# Patient Record
Sex: Male | Born: 1980 | Marital: Single | Smoking: Current every day smoker
Health system: Southern US, Community
[De-identification: ages and names within clinical notes are randomized; demographics above are authoritative.]

## PROBLEM LIST (undated history)

## (undated) DIAGNOSIS — I998 Other disorder of circulatory system: Secondary | ICD-10-CM

---

## 2008-01-20 ENCOUNTER — Ambulatory Visit (HOSPITAL_BASED_OUTPATIENT_CLINIC_OR_DEPARTMENT_OTHER): Admission: RE | Admit: 2008-01-20 | Discharge: 2008-01-20 | Payer: Self-pay | Admitting: Physician Assistant

## 2008-01-29 ENCOUNTER — Ambulatory Visit: Payer: Self-pay | Admitting: Internal Medicine

## 2010-11-16 ENCOUNTER — Encounter: Payer: Self-pay | Admitting: Family Medicine

## 2011-03-13 NOTE — Procedures (Signed)
NAME:  Jeremiah Mathis, Jeremiah Mathis NO.:  [id number]   MEDICAL RECORD NO.:  [id number]          PATIENT TYPE:  OUT   LOCATION:  SLEEP CENTER                 FACILITY:  [facility]   PHYSICIAN:  Clinton D. Maple Hudson, MD, FCCP, FACPDATE OF BIRTH:  Mar 14, 1979   DATE OF STUDY:                            NOCTURNAL POLYSOMNOGRAM   REFERRING PHYSICIAN:   REFERRING PHYSICIAN:  Lindaann Pascal, M.D.   INDICATION FOR STUDY:  Hypersomnia with sleep apnea.   EPWORTH SLEEPINESS SCORE:  19/24.   BMI:  20.8.   WEIGHT:  162 pounds.   HEIGHT:  74 inches.   NECK:  15.5 inches.   HOME MEDICATION:  Charted and reviewed.   SLEEP ARCHITECTURE:  Total sleep 383 minutes with sleep efficiency  96.1%.  Stage 1 was 3%, stage 2, 55%, stage 3, 23%.  REM 18.5% of total  sleep time.  Sleep latency 6 minutes.  REM latency 99 minutes.  Awake  after sleep onset 9.5 minutes, arousal index 6.3.  No bedtime medication  was taken.   RESPIRATORY DATA:  Apnea-hypopnea index (AHI) 3.8 per hour which is  normal.  A total of 24 events were counted of which 20 were obstructive  sleep apneas, 1 central apnea, 1 mixed apnea, and 2 hypopneas.  Most  events were counted while sleeping off of his back.  There were  insufficient events to permit CPAP titration with protocol on this study  night.   OXYGEN DATA:  Moderate snoring with oxygen desaturation to a nadir of  90%.  Mean oxygen saturation through the study was 95.7% on room air.   CARDIAC DATA:  Normal sinus rhythm with PACs.   IMPRESSION:  1. Occasional respiratory events, within normal limits, AHI 3.8 per      hour (normal range 0-5 per hour).  Events were more common while      sleeping off flat of back.  Moderate snoring with oxygen      desaturation to a nadir of 90%.  2. Scores in this range are not usually addressed with CPAP.  If      daytime somnolence is a significant concern, consider having      patient return for a multiple sleep latency test which  is      specifically for assessment of excessive daytime sleepiness.      Clinton D. Maple Hudson, MD, [facility], FACP  Diplomate, Biomedical engineer of Sleep Medicine  Electronically Signed     CDY/MEDQ  D:  01/28/2008 14:55:41  T:  01/28/2008 15:46:43  Job:  [id number]   cc:   Lindaann Pascal, M.D.

## 2017-11-26 DIAGNOSIS — G4719 Other hypersomnia: Secondary | ICD-10-CM | POA: Insufficient documentation

## 2018-01-25 DIAGNOSIS — E039 Hypothyroidism, unspecified: Secondary | ICD-10-CM | POA: Insufficient documentation

## 2018-01-25 DIAGNOSIS — E782 Mixed hyperlipidemia: Secondary | ICD-10-CM | POA: Insufficient documentation

## 2018-11-17 ENCOUNTER — Emergency Department (HOSPITAL_BASED_OUTPATIENT_CLINIC_OR_DEPARTMENT_OTHER): Payer: Self-pay

## 2018-11-17 ENCOUNTER — Encounter (HOSPITAL_BASED_OUTPATIENT_CLINIC_OR_DEPARTMENT_OTHER): Payer: Self-pay | Admitting: *Deleted

## 2018-11-17 ENCOUNTER — Emergency Department (HOSPITAL_BASED_OUTPATIENT_CLINIC_OR_DEPARTMENT_OTHER)
Admission: EM | Admit: 2018-11-17 | Discharge: 2018-11-17 | Disposition: A | Discharge status: Home / Self Care | Payer: Self-pay | Attending: Emergency Medicine | Admitting: Emergency Medicine

## 2018-11-17 ENCOUNTER — Other Ambulatory Visit: Payer: Self-pay

## 2018-11-17 DIAGNOSIS — R0781 Pleurodynia: Secondary | ICD-10-CM

## 2018-11-17 DIAGNOSIS — M7989 Other specified soft tissue disorders: Secondary | ICD-10-CM | POA: Insufficient documentation

## 2018-11-17 DIAGNOSIS — F17210 Nicotine dependence, cigarettes, uncomplicated: Secondary | ICD-10-CM | POA: Insufficient documentation

## 2018-11-17 LAB — CBC WITH DIFFERENTIAL/PLATELET
Abs Immature Granulocytes: 0.05 10*3/uL (ref 0.00–0.07)
Basophils Absolute: 0.1 10*3/uL (ref 0.0–0.1)
Basophils Relative: 1 %
EOS PCT: 6 %
Eosinophils Absolute: 0.7 10*3/uL — ABNORMAL HIGH (ref 0.0–0.5)
HEMATOCRIT: 47.5 % (ref 39.0–52.0)
Hemoglobin: 15.3 g/dL (ref 13.0–17.0)
Immature Granulocytes: 0 %
LYMPHS ABS: 2.8 10*3/uL (ref 0.7–4.0)
Lymphocytes Relative: 25 %
MCH: 30.9 pg (ref 26.0–34.0)
MCHC: 32.2 g/dL (ref 30.0–36.0)
MCV: 96 fL (ref 80.0–100.0)
MONO ABS: 0.7 10*3/uL (ref 0.1–1.0)
MONOS PCT: 6 %
Neutro Abs: 6.9 10*3/uL (ref 1.7–7.7)
Neutrophils Relative %: 62 %
Platelets: 347 10*3/uL (ref 150–400)
RBC: 4.95 MIL/uL (ref 4.22–5.81)
RDW: 13.3 % (ref 11.5–15.5)
WBC: 11.3 10*3/uL — ABNORMAL HIGH (ref 4.0–10.5)
nRBC: 0 % (ref 0.0–0.2)

## 2018-11-17 LAB — COMPREHENSIVE METABOLIC PANEL
ALK PHOS: 68 U/L (ref 38–126)
ALT: 32 U/L (ref 0–44)
ANION GAP: 7 (ref 5–15)
AST: 49 U/L — ABNORMAL HIGH (ref 15–41)
Albumin: 4.1 g/dL (ref 3.5–5.0)
BILIRUBIN TOTAL: 0.3 mg/dL (ref 0.3–1.2)
BUN: 11 mg/dL (ref 6–20)
CALCIUM: 9.1 mg/dL (ref 8.9–10.3)
CO2: 27 mmol/L (ref 22–32)
Chloride: 103 mmol/L (ref 98–111)
Creatinine, Ser: 1.14 mg/dL (ref 0.61–1.24)
GLUCOSE: 78 mg/dL (ref 70–99)
Potassium: 3.9 mmol/L (ref 3.5–5.1)
Sodium: 137 mmol/L (ref 135–145)
TOTAL PROTEIN: 6.7 g/dL (ref 6.5–8.1)

## 2018-11-17 LAB — PROTIME-INR
INR: 0.9
Prothrombin Time: 12 seconds (ref 11.4–15.2)

## 2018-11-17 LAB — URINALYSIS, ROUTINE W REFLEX MICROSCOPIC
Bilirubin Urine: NEGATIVE
Glucose, UA: NEGATIVE mg/dL
Hgb urine dipstick: NEGATIVE
Ketones, ur: NEGATIVE mg/dL
Leukocytes, UA: NEGATIVE
Nitrite: NEGATIVE
PH: 7 (ref 5.0–8.0)
Protein, ur: NEGATIVE mg/dL
Specific Gravity, Urine: 1.01 (ref 1.005–1.030)

## 2018-11-17 LAB — LIPASE, BLOOD: Lipase: 53 U/L — ABNORMAL HIGH (ref 11–51)

## 2018-11-17 MED ORDER — SODIUM CHLORIDE 0.9 % IV SOLN
Freq: Once | INTRAVENOUS | Status: AC
  Administered 2018-11-17: 18:00:00 via INTRAVENOUS

## 2018-11-17 MED ORDER — IOPAMIDOL (ISOVUE-370) INJECTION 76%
100.0000 mL | Freq: Once | INTRAVENOUS | Status: AC | PRN
  Administered 2018-11-17: 100 mL via INTRAVENOUS

## 2018-11-17 NOTE — ED Notes (Signed)
Pt on monitor 

## 2018-11-17 NOTE — ED Triage Notes (Signed)
Pt c/o chest pain med sternal x 2 weeks

## 2018-11-17 NOTE — ED Provider Notes (Signed)
[facility] EMERGENCY DEPARTMENT Provider Note   CSN: [id number] Arrival date & time: 11/17/18  1631     History   Chief Complaint Chief Complaint  Patient presents with  . Chest Pain    HPI Jeremiah Mathis is a 38 y.o. male.  HPI Patient reports that he gets intermittent chest pains.  They are sharp in nature.  They occur in different parts of his chest.  He reports he does feel short of breath with that.  Patient reports that he does have a history of swelling in the left leg and superficial clot.  He used to drive truck over the road.  He reports he had stopped doing that but he still has a problem with swelling in that leg.  He reports his mother had DVTs and he is worried about that.  He reports that he has not had fever or cough.  He reports sometimes also if he has been sitting upright position he has numbness in both of his legs.  This resolves if he gets up and moves.  He denies he has pain in his back.  He denies difficulty urinating pain burning or incontinence.  No vomiting or diarrhea.  Patient reports he did see a doctor at [facility] several years ago, at that time the told him he had a superficial clot in the leg but did not need any treatment.  He reports that due to working a lot and then loss of insurance he has not seen a physician in the meantime.  Reports he has been pretty worried about the possibility of getting a blood clot to the lungs. History reviewed. No pertinent past medical history.  There are no active problems to display for this patient.   History reviewed. No pertinent surgical history.      Home Medications    Prior to Admission medications   Not on File    Family History No family history on file.  Social History Social History   Tobacco Use  . Smoking status: Current Every Day Smoker    Packs/day: 1.00    Types: Cigarettes  . Smokeless tobacco: Never Used  Substance Use Topics  . Alcohol use: Not Currently  . Drug use:  Not Currently     Allergies   Patient has no known allergies.   Review of Systems Review of Systems 10 Systems reviewed and are negative for acute change except as noted in the HPI.   Physical Exam Updated Vital Signs BP 101/72   Pulse 77   Temp 98.3 F (36.8 C) (Oral)   Resp 15   Ht 6\' 1"  (1.854 m)   Wt 127 kg   SpO2 100%   BMI 36.94 kg/m   Physical Exam Constitutional:      Appearance: Normal appearance. He is well-developed.  HENT:     Head: Normocephalic and atraumatic.  Eyes:     Extraocular Movements: Extraocular movements intact.  Neck:     Musculoskeletal: Neck supple.  Cardiovascular:     Rate and Rhythm: Normal rate and regular rhythm.     Heart sounds: Normal heart sounds.  Pulmonary:     Effort: Pulmonary effort is normal.     Breath sounds: Normal breath sounds.  Chest:     Chest wall: No tenderness.  Abdominal:     General: Bowel sounds are normal. There is no distension.     Palpations: Abdomen is soft.     Tenderness: There is no abdominal tenderness.  Musculoskeletal: Normal range of motion.     Comments: Patient has edema around the ankle of the left lower extremity.  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  The calf is nontender.  Appears slightly more swollen than the right.  Feet are warm and dry.  Dorsalis pedis pulses symmetric.  Skin:    General: Skin is warm and dry.  Neurological:     General: No focal deficit present.     Mental Status: He is alert and oriented to person, place, and time.     GCS: GCS eye subscore is 4. GCS verbal subscore is 5. GCS motor subscore is 6.     Sensory: No sensory deficit.     Motor: No weakness.     Coordination: Coordination normal.  Psychiatric:        Mood and Affect: Mood normal.      ED Treatments / Results  Labs (all labs ordered are listed, but only abnormal results are displayed) Labs Reviewed  COMPREHENSIVE METABOLIC PANEL - Abnormal; Notable for the following components:      Result Value    AST 49 (*)    All other components within normal limits  LIPASE, BLOOD - Abnormal; Notable for the following components:   Lipase 53 (*)    All other components within normal limits  CBC WITH DIFFERENTIAL/PLATELET - Abnormal; Notable for the following components:   WBC 11.3 (*)    Eosinophils Absolute 0.7 (*)    All other components within normal limits  PROTIME-INR  URINALYSIS, ROUTINE W REFLEX MICROSCOPIC    EKG EKG Interpretation  Date/Time:  Thursday November 17 2018 16:40:08 EST Ventricular Rate:  91 PR Interval:  134 QRS Duration: 90 QT Interval:  354 QTC Calculation: 435 R Axis:   69 Text Interpretation:  Normal sinus rhythm Normal ECG agree. no old comparison Confirmed by Arby Barrette [phone]) on 11/17/2018 5:53:38 PM   Radiology Ct Angio Chest Pe W/cm &/or Wo Cm  Result Date: 11/17/2018 CLINICAL DATA:  Chest pain EXAM: CT ANGIOGRAPHY CHEST WITH CONTRAST TECHNIQUE: Multidetector CT imaging of the chest was performed using the standard protocol during bolus administration of intravenous contrast. Multiplanar CT image reconstructions and MIPs were obtained to evaluate the vascular anatomy. CONTRAST:  ISOVUE-370 IOPAMIDOL (ISOVUE-370) INJECTION 76% COMPARISON:  None. FINDINGS: Cardiovascular: Satisfactory opacification of the pulmonary arteries to the segmental level. No evidence of pulmonary embolism. Normal heart size. No pericardial effusion. Nonaneurysmal aorta. Mediastinum/Nodes: No enlarged mediastinal, hilar, or axillary lymph nodes. Thyroid gland, trachea, and esophagus demonstrate no significant findings. Lungs/Pleura: 2 mm left upper lobe pulmonary nodule, series 6, image number 23. No consolidation or effusion. No pneumothorax Upper Abdomen: No acute abnormality. Musculoskeletal: Degenerative changes and scoliosis. No acute osseous abnormality Review of the MIP images confirms the above findings. IMPRESSION: 1. Negative for acute pulmonary embolus. 2. 2 mm left  upper lobe pulmonary nodule. No follow-up needed if patient is low-risk. Non-contrast chest CT can be considered in 12 months if patient is high-risk. This recommendation follows the consensus statement: Guidelines for Management of Incidental Pulmonary Nodules Detected on CT Images: From the Fleischner Society 2017; Radiology 2017; 284:228-243. Electronically Signed   By: Jasmine Pang M.D.   On: 11/17/2018 19:41    Procedures Procedures (including critical care time)  Medications Ordered in ED Medications  0.9 %  sodium chloride infusion ( Intravenous New Bag/Given 11/17/18 1822)  iopamidol (ISOVUE-370) 76 % injection 100 mL (100 mLs Intravenous Contrast Given 11/17/18 1914)  Initial Impression / Assessment and Plan / ED Course  I have reviewed the triage vital signs and the nursing notes.  Pertinent labs & imaging results that were available during my care of the patient were reviewed by me and considered in my medical decision making (see chart for details).    Patient reports several episodes of sharp chest pains that migrate from one side to the other.  This has been going on for several months.  His main concern was for possible pulmonary embolus.  CT rules out PE.  He is otherwise clinically well in appearance.  He does have slight asymmetric swelling of the left lower extremity as opposed to the right.  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  Will write prescription for repeat outpatient lower extremity ultrasound.  Patient is counseled on necessity for close follow-up with primary care doctor.  Resources provided.  Return precautions reviewed.  Final Clinical Impressions(s) / ED Diagnoses   Final diagnoses:  Pleuritic chest pain  Swelling of left lower extremity    ED Discharge Orders    None       Arby BarrettePfeiffer, Shahad Mazurek, MD 11/17/18 2010

## 2018-11-17 NOTE — ED Notes (Signed)
Patient transported to CT 

## 2018-11-17 NOTE — Discharge Instructions (Signed)
1.  Return to med [facility] tomorrow for ultrasound of the lower extremity.  2.  Make an appointment with the family doctor soon as possible.  You can go to [facility] and wellness center and fill out new patient paperwork. 3.  Return to the emergency department if you are getting new or worsening symptoms.

## 2018-11-18 ENCOUNTER — Ambulatory Visit (HOSPITAL_BASED_OUTPATIENT_CLINIC_OR_DEPARTMENT_OTHER)
Admission: RE | Admit: 2018-11-18 | Discharge: 2018-11-18 | Disposition: A | Discharge status: Home / Self Care | Payer: Self-pay | Source: Ambulatory Visit | Attending: Emergency Medicine | Admitting: Emergency Medicine

## 2018-11-18 DIAGNOSIS — M7989 Other specified soft tissue disorders: Secondary | ICD-10-CM | POA: Insufficient documentation

## 2018-11-19 ENCOUNTER — Inpatient Hospital Stay (HOSPITAL_BASED_OUTPATIENT_CLINIC_OR_DEPARTMENT_OTHER): Admission: RE | Admit: 2018-11-19 | Payer: Self-pay | Source: Ambulatory Visit

## 2019-05-10 DIAGNOSIS — F17210 Nicotine dependence, cigarettes, uncomplicated: Secondary | ICD-10-CM | POA: Insufficient documentation

## 2019-07-15 ENCOUNTER — Other Ambulatory Visit: Payer: Self-pay

## 2019-07-15 ENCOUNTER — Encounter (HOSPITAL_BASED_OUTPATIENT_CLINIC_OR_DEPARTMENT_OTHER): Payer: Self-pay | Admitting: Emergency Medicine

## 2019-07-15 ENCOUNTER — Emergency Department (HOSPITAL_BASED_OUTPATIENT_CLINIC_OR_DEPARTMENT_OTHER)
Admission: EM | Admit: 2019-07-15 | Discharge: 2019-07-15 | Disposition: A | Discharge status: Home / Self Care | Payer: 59 | Attending: Emergency Medicine | Admitting: Emergency Medicine

## 2019-07-15 DIAGNOSIS — Z20828 Contact with and (suspected) exposure to other viral communicable diseases: Secondary | ICD-10-CM | POA: Diagnosis not present

## 2019-07-15 DIAGNOSIS — F17210 Nicotine dependence, cigarettes, uncomplicated: Secondary | ICD-10-CM | POA: Insufficient documentation

## 2019-07-15 DIAGNOSIS — R5383 Other fatigue: Secondary | ICD-10-CM | POA: Diagnosis not present

## 2019-07-15 DIAGNOSIS — R05 Cough: Secondary | ICD-10-CM | POA: Diagnosis present

## 2019-07-15 DIAGNOSIS — J069 Acute upper respiratory infection, unspecified: Secondary | ICD-10-CM

## 2019-07-15 DIAGNOSIS — R0981 Nasal congestion: Secondary | ICD-10-CM | POA: Insufficient documentation

## 2019-07-15 DIAGNOSIS — Z20822 Contact with and (suspected) exposure to covid-19: Secondary | ICD-10-CM

## 2019-07-15 NOTE — ED Triage Notes (Signed)
Pt here with URI symptoms x 3 days. Subjective fever noted, but normal on arrival here today.

## 2019-07-15 NOTE — ED Provider Notes (Signed)
[facility] EMERGENCY DEPARTMENT Provider Note   CSN: [id number] Arrival date & time: 07/15/19  1123     History   Chief Complaint Chief Complaint  Patient presents with  . Cough  . Nasal Congestion  . Fatigue    HPI Jeremiah Mathis is a 38 y.o. male.  Presents the ER with upper respiratory symptoms for 3 days.  States that she has been having intermittent cough, nonbloody nonproductive.  No shortness of breath.  Has had chills, subjective fever but has not checked his temperature.  Has not taken any medications for this.  Also having some increased nasal congestion, no sore throat.  Mild myalgias.     HPI  History reviewed. No pertinent past medical history.  There are no active problems to display for this patient.   History reviewed. No pertinent surgical history.      Home Medications    Prior to Admission medications   Not on File    Family History History reviewed. No pertinent family history.  Social History Social History   Tobacco Use  . Smoking status: Current Every Day Smoker    Packs/day: 1.00    Types: Cigarettes  . Smokeless tobacco: Never Used  Substance Use Topics  . Alcohol use: Not Currently  . Drug use: Not Currently     Allergies   Patient has no known allergies.   Review of Systems Review of Systems  Constitutional: Positive for chills and fever.  HENT: Positive for sinus pressure. Negative for ear pain and sore throat.   Eyes: Negative for pain and visual disturbance.  Respiratory: Positive for cough. Negative for shortness of breath.   Cardiovascular: Negative for chest pain and palpitations.  Gastrointestinal: Negative for abdominal pain and vomiting.  Genitourinary: Negative for dysuria and hematuria.  Musculoskeletal: Positive for myalgias. Negative for arthralgias and back pain.  Skin: Negative for color change and rash.  Neurological: Negative for seizures and syncope.  All other systems reviewed and are  negative.    Physical Exam Updated Vital Signs BP 119/83   Pulse 74   Temp 99.4 F (37.4 C) (Oral)   SpO2 100%   Physical Exam Vitals signs and nursing note reviewed.  Constitutional:      Appearance: He is well-developed.  HENT:     Head: Normocephalic and atraumatic.  Eyes:     Conjunctiva/sclera: Conjunctivae normal.  Neck:     Musculoskeletal: Neck supple.  Cardiovascular:     Rate and Rhythm: Normal rate and regular rhythm.     Heart sounds: No murmur.  Pulmonary:     Effort: Pulmonary effort is normal. No respiratory distress.     Breath sounds: Normal breath sounds.  Abdominal:     Palpations: Abdomen is soft.     Tenderness: There is no abdominal tenderness.  Musculoskeletal:        General: No swelling or tenderness.  Skin:    General: Skin is warm and dry.     Capillary Refill: Capillary refill takes less than 2 seconds.  Neurological:     General: No focal deficit present.     Mental Status: He is alert.      ED Treatments / Results  Labs (all labs ordered are listed, but only abnormal results are displayed) Labs Reviewed  SARS CORONAVIRUS 2 (TAT 6-24 HRS)    EKG None  Radiology No results found.  Procedures Procedures (including critical care time)  Medications Ordered in ED Medications - No data to display  Initial Impression / Assessment and Plan / ED Course  I have reviewed the triage vital signs and the nursing notes.  Pertinent labs & imaging results that were available during my care of the patient were reviewed by me and considered in my medical decision making (see chart for details).        38 year old male presents to ER with upper respiratory symptoms.  On exam noted to be well-appearing with normal vital signs, clear lungs, no tachypnea, normal pulse ox.  I will check for COVID-19.  Suspect most likely viral in etiology.  Will discharge home.    After the discussed management above, the patient was determined to be  safe for discharge.  The patient was in agreement with this plan and all questions regarding their care were answered.  ED return precautions were discussed and the patient will return to the ED with any significant worsening of condition.    Final Clinical Impressions(s) / ED Diagnoses   Final diagnoses:  Suspected Covid-19 Virus Infection  Viral upper respiratory tract infection    ED Discharge Orders    None       Lucrezia Starch, MD 07/15/19 [phone]

## 2019-07-15 NOTE — Discharge Instructions (Signed)
If he continues to have any symptoms next week, recommend schedule follow-up appointment for close recheck early next week with your primary doctor.  If you develop any difficulty breathing, worsening fever, vomiting or other new concerning symptom recommend return to ER for reassessment.  Recommend following isolation precautions as discussed until we have the results of your COVID-19 test.

## 2019-07-16 LAB — SARS CORONAVIRUS 2 (TAT 6-24 HRS): SARS Coronavirus 2: NEGATIVE

## 2020-01-10 ENCOUNTER — Other Ambulatory Visit: Payer: Self-pay

## 2020-01-10 ENCOUNTER — Emergency Department (HOSPITAL_BASED_OUTPATIENT_CLINIC_OR_DEPARTMENT_OTHER)
Admission: EM | Admit: 2020-01-10 | Discharge: 2020-01-10 | Disposition: A | Discharge status: Home / Self Care | Payer: 59 | Attending: Emergency Medicine | Admitting: Emergency Medicine

## 2020-01-10 ENCOUNTER — Emergency Department (HOSPITAL_BASED_OUTPATIENT_CLINIC_OR_DEPARTMENT_OTHER): Payer: 59

## 2020-01-10 ENCOUNTER — Encounter (HOSPITAL_BASED_OUTPATIENT_CLINIC_OR_DEPARTMENT_OTHER): Payer: Self-pay | Admitting: Emergency Medicine

## 2020-01-10 DIAGNOSIS — M25561 Pain in right knee: Secondary | ICD-10-CM | POA: Insufficient documentation

## 2020-01-10 NOTE — ED Provider Notes (Signed)
Emergency Department Provider Note   I have reviewed the triage vital signs and the nursing notes.   HISTORY  Chief Complaint Knee Pain   HPI Jeremiah Mathis is a 39 y.o. male presents to the emergency department for evaluation of right knee pain over the past 4 to 5 days.  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  He states that as he was bending he had sudden severe pain in his right knee and he rolled onto his side.  He has had knee stiffness and mild swelling since that time.  He denies any weakness or numbness in the lower leg.  No fevers, chills, joint redness/warmth. No hip or ankle pain. He notes that actually this afternoon the knee is feeling better and his ROM if improved compared to days prior.   History reviewed. No pertinent past medical history.  There are no problems to display for this patient.   History reviewed. No pertinent surgical history.  Allergies Patient has no known allergies.  No family history on file.  Social History Social History   Tobacco Use  . Smoking status: Current Every Day Smoker    Packs/day: 1.00    Types: Cigarettes  . Smokeless tobacco: Never Used  Substance Use Topics  . Alcohol use: Not Currently  . Drug use: Not Currently    Review of Systems  Constitutional: No fever/chills Musculoskeletal: Right knee pain.  Skin: Negative for rash.  ____________________________________________   PHYSICAL EXAM:  VITAL SIGNS: ED Triage Vitals  Enc Vitals Group     BP 01/10/20 2055 121/88     Pulse Rate 01/10/20 2055 88     Resp 01/10/20 2055 16     Temp 01/10/20 2055 98.3 F (36.8 C)     Temp Source 01/10/20 2055 Oral     SpO2 01/10/20 2055 99 %     Weight 01/10/20 2056 279 lb 15.8 oz (127 kg)     Height 01/10/20 2056 6' (1.829 m)   Constitutional: Alert and oriented. Well appearing and in no acute distress. Eyes: Conjunctivae are normal.  Head: Atraumatic. Nose: No  congestion/rhinnorhea. Neck: No stridor.   Cardiovascular: Good peripheral circulation.  Respiratory: Normal respiratory effort.  Gastrointestinal: No distention.  Musculoskeletal: Mild right knee effusion without erythema or warmth. No knee joint laxity on the right. No point tenderness over the patella. No unilateral leg swelling.  Neurologic:  Normal speech and language.  Skin:  Skin is warm, dry and intact. No rash noted.  ____________________________________________  RADIOLOGY  DG Knee Complete 4 Views Right  Result Date: 01/10/2020 CLINICAL DATA:  Right medial knee pain the past 5 days. EXAM: RIGHT KNEE - COMPLETE 4+ VIEW COMPARISON:  None. FINDINGS: Small right knee effusion. Mild prepatellar soft tissue thickening. Thickening of the infrapatellar tendon. Normal joint spaces and alignment. Tiny degenerative bone spurs at the superior patellar facet and tibial eminences. Normal bone mineralization. No acute fracture lucency or radiopaque foreign body or suspicious osseous lesion. IMPRESSION: Small right knee effusion and mild prepatellar soft tissue edema with findings that would support a diagnosis of a right infrapatellar tendinopathy. No acute bony injury of the right knee. Electronically Signed   By: Revonda Humphrey   On: 01/10/2020 22:18    ____________________________________________   PROCEDURES  Procedure(s) performed:   Procedures  None  ____________________________________________   INITIAL IMPRESSION / ASSESSMENT AND PLAN / ED COURSE  Pertinent labs & imaging results that were  available during my care of the patient were reviewed by me and considered in my medical decision making (see chart for details).   Patient presents emergency department for evaluation of right knee pain in the setting of injury several days ago while bending down at work.  No concern clinically for septic joint.  No clear joint laxities to suspect ACL type injury.  Patient is ambulatory here.   Plain film obtained and reviewed showing small right knee effusion with question of right infrapatellar tendinopathy.  Plan for NSAIDs and will refer to sports medicine for follow-up. Discussed plan with patient who is in agreement.    ____________________________________________  FINAL CLINICAL IMPRESSION(S) / ED DIAGNOSES  Final diagnoses:  Acute pain of right knee    Note:  This document was prepared using Dragon voice recognition software and may include unintentional dictation errors.  Alona Bene, MD, [facility] Emergency Medicine    Tamula Morrical, Arlyss Repress, MD 01/10/20 2237

## 2020-01-10 NOTE — Discharge Instructions (Signed)
You are seen in the emergency department today with knee pain.  Your x-ray does not show fracture but you do have a small joint effusion and possible tendon strain.  I am referring you to a sports medicine doctor.  Please call tomorrow to schedule the next available follow-up appointment.  You can use Tylenol and or Motrin as needed for pain.

## 2020-01-10 NOTE — ED Notes (Signed)
Pt ambulated to radiology

## 2020-01-10 NOTE — ED Triage Notes (Addendum)
Right  knee pain for 5 days. States bent "wrong" at work. PT ambulatory.

## 2020-01-19 ENCOUNTER — Ambulatory Visit: Payer: Self-pay

## 2020-01-19 ENCOUNTER — Other Ambulatory Visit: Payer: Self-pay

## 2020-01-19 ENCOUNTER — Ambulatory Visit (INDEPENDENT_AMBULATORY_CARE_PROVIDER_SITE_OTHER): Payer: 59 | Admitting: Family Medicine

## 2020-01-19 ENCOUNTER — Encounter: Payer: Self-pay | Admitting: Family Medicine

## 2020-01-19 VITALS — BP 115/76 | Ht 72.0 in | Wt 260.0 lb

## 2020-01-19 DIAGNOSIS — S8391XA Sprain of unspecified site of right knee, initial encounter: Secondary | ICD-10-CM | POA: Insufficient documentation

## 2020-01-19 DIAGNOSIS — M25561 Pain in right knee: Secondary | ICD-10-CM

## 2020-01-19 NOTE — Patient Instructions (Signed)
Nice to meet you Please try the brace  Please tr ice  Please try the exercises   Please send me a message in MyChart with any questions or updates.  Please see me back in 4 weeks.   --Dr. Jordan Likes

## 2020-01-19 NOTE — Progress Notes (Signed)
Jeremiah Mathis - 39 y.o. male MRN [id number]  Date of birth: 1981-04-22  SUBJECTIVE:  Including CC & ROS.  Chief Complaint  Patient presents with  . Knee Pain    right knee x 8-9 days    Jeremiah Mathis is a 39 y.o. male that is presenting with right knee pain.  The pain is occurring over the medial aspect of the knee.  It occurred when he was at work.  He stepped a certain way and felt a pop in the knee.  He was seen in the emergency department.  The pain has improved since that time.  Denies any history of similar pain.  No prior history of surgery..  Independent review of the right knee x-ray from 3/17 shows a possible effusion .   Review of Systems See HPI   HISTORY: Past Medical, Surgical, Social, and Family History Reviewed & Updated per EMR.   Pertinent Historical Findings include:  No past medical history on file.  No past surgical history on file.  No family history on file.  Social History   Socioeconomic History  . Marital status: Single    Spouse name: Not on file  . Number of children: Not on file  . Years of education: Not on file  . Highest education level: Not on file  Occupational History  . Not on file  Tobacco Use  . Smoking status: Current Every Day Smoker    Packs/day: 1.00    Types: Cigarettes  . Smokeless tobacco: Never Used  Substance and Sexual Activity  . Alcohol use: Not Currently  . Drug use: Not Currently  . Sexual activity: Not Currently  Other Topics Concern  . Not on file  Social History Narrative  . Not on file   Social Determinants of Health   Financial Resource Strain:   . Difficulty of Paying Living Expenses:   Food Insecurity:   . Worried About Programme researcher, broadcasting/film/video in the Last Year:   . Barista in the Last Year:   Transportation Needs:   . Freight forwarder (Medical):   Marland Kitchen Lack of Transportation (Non-Medical):   Physical Activity:   . Days of Exercise per Week:   . Minutes of Exercise per Session:   Stress:    . Feeling of Stress :   Social Connections:   . Frequency of Communication with Friends and Family:   . Frequency of Social Gatherings with Friends and Family:   . Attends Religious Services:   . Active Member of Clubs or Organizations:   . Attends Banker Meetings:   Marland Kitchen Marital Status:   Intimate Partner Violence:   . Fear of Current or Ex-Partner:   . Emotionally Abused:   Marland Kitchen Physically Abused:   . Sexually Abused:      PHYSICAL EXAM:  VS: BP 115/76   Ht 6' (1.829 m)   Wt 260 lb (117.9 kg)   BMI 35.26 kg/m  Physical Exam Gen: NAD, alert, cooperative with exam, well-appearing MSK:  Right knee: No effusion. Some tenderness palpation over the medial retinaculum. Some tenderness palpation over the medial joint line. Normal range of motion. No instability. Normal strength resistance. Negative Murray's test. Neurovascularly intact  Limited ultrasound: Right knee:  No effusion within the suprapatellar pouch. Normal-appearing quadricep and patellar tendon. Normal-appearing medial and lateral joint line and meniscus. Areas of the medial retinaculum that have increased hyperemia.  Summary: Possible for irritation of the retinaculum   Ultrasound and interpretation by  Clearance Coots, MD     ASSESSMENT & PLAN:   Sprain of right knee No effusion within the suprapatellar pouch.  Had an injury while occurred at work.  Has had improvement recently.  There appears some increased hyperemia of the retinaculum on the medial aspect.  May have a slight subluxation of the patella. -Counseled on home exercise therapy and supportive care. -Brace. -Could consider injection, nitro, or physical therapy.

## 2020-01-19 NOTE — Assessment & Plan Note (Signed)
No effusion within the suprapatellar pouch.  Had an injury while occurred at work.  Has had improvement recently.  There appears some increased hyperemia of the retinaculum on the medial aspect.  May have a slight subluxation of the patella. -Counseled on home exercise therapy and supportive care. -Brace. -Could consider injection, nitro, or physical therapy.

## 2020-01-25 ENCOUNTER — Emergency Department (HOSPITAL_BASED_OUTPATIENT_CLINIC_OR_DEPARTMENT_OTHER)
Admission: EM | Admit: 2020-01-25 | Discharge: 2020-01-25 | Disposition: A | Discharge status: Home / Self Care | Payer: 59 | Attending: Emergency Medicine | Admitting: Emergency Medicine

## 2020-01-25 ENCOUNTER — Emergency Department (HOSPITAL_BASED_OUTPATIENT_CLINIC_OR_DEPARTMENT_OTHER): Payer: 59

## 2020-01-25 ENCOUNTER — Other Ambulatory Visit: Payer: Self-pay

## 2020-01-25 ENCOUNTER — Encounter (HOSPITAL_BASED_OUTPATIENT_CLINIC_OR_DEPARTMENT_OTHER): Payer: Self-pay

## 2020-01-25 DIAGNOSIS — S39012A Strain of muscle, fascia and tendon of lower back, initial encounter: Secondary | ICD-10-CM | POA: Insufficient documentation

## 2020-01-25 DIAGNOSIS — F17210 Nicotine dependence, cigarettes, uncomplicated: Secondary | ICD-10-CM | POA: Insufficient documentation

## 2020-01-25 DIAGNOSIS — X500XXA Overexertion from strenuous movement or load, initial encounter: Secondary | ICD-10-CM | POA: Diagnosis not present

## 2020-01-25 DIAGNOSIS — Y9389 Activity, other specified: Secondary | ICD-10-CM | POA: Insufficient documentation

## 2020-01-25 DIAGNOSIS — Y929 Unspecified place or not applicable: Secondary | ICD-10-CM | POA: Diagnosis not present

## 2020-01-25 DIAGNOSIS — Y999 Unspecified external cause status: Secondary | ICD-10-CM | POA: Insufficient documentation

## 2020-01-25 DIAGNOSIS — S3992XA Unspecified injury of lower back, initial encounter: Secondary | ICD-10-CM | POA: Diagnosis present

## 2020-01-25 HISTORY — DX: Other disorder of circulatory system: I99.8

## 2020-01-25 MED ORDER — KETOROLAC TROMETHAMINE 60 MG/2ML IM SOLN
30.0000 mg | Freq: Once | INTRAMUSCULAR | Status: AC
  Administered 2020-01-25: 30 mg via INTRAMUSCULAR
  Filled 2020-01-25: qty 2

## 2020-01-25 MED ORDER — METHOCARBAMOL 500 MG PO TABS: 500.0000 mg | ORAL_TABLET | Freq: Three times a day (TID) | ORAL | 0 refills | Status: AC | PRN

## 2020-01-25 MED ORDER — NAPROXEN 500 MG PO TABS: 500.0000 mg | ORAL_TABLET | Freq: Two times a day (BID) | ORAL | 0 refills | Status: AC

## 2020-01-25 MED ORDER — METHOCARBAMOL 500 MG PO TABS
500.0000 mg | ORAL_TABLET | Freq: Once | ORAL | Status: AC
  Administered 2020-01-25: 500 mg via ORAL
  Filled 2020-01-25: qty 1

## 2020-01-25 NOTE — Discharge Instructions (Signed)
Recommend taking prescribed anti-inflammatories as well as muscle relaxer for your symptoms.  Recommend recheck with primary doctor.  Return to ER if you develop numbness, weakness, difficulty walking or other new concerning symptom.  Recommend refraining from any heavy lifting over the next week.

## 2020-01-25 NOTE — ED Triage Notes (Signed)
Pt states he was leaning on his desk at work and when he straitened out had sudden lower back pain with radiation down both legs. Pt states now the pain predominately radiates down R leg.

## 2020-01-25 NOTE — ED Provider Notes (Signed)
[facility] EMERGENCY DEPARTMENT Provider Note   CSN: [id number] Arrival date & time: 01/25/20  2021     History Chief Complaint  Patient presents with  . Back Pain    Jeremiah Mathis is a 39 y.o. male.  Presented to ER with chief complaint low back pain.  Patient states that he was at his desk bending over to pick something up when he suddenly felt like his back locked up.  Had sudden onset of severe low back pain.  Radiating down both legs.  Pain seemed to improve some but still is quite severe, up to 7 out of 10 in severity.  Worse with movement, improved with rest.  Now predominantly radiating down right leg.  Has been able to walk albeit with some pain and difficulty.  No associated numbness, weakness, bladder or bowel incontinence.  Denies prior history of any medical problems, no history of IVDU.  No known trauma to back. HPI     Past Medical History:  Diagnosis Date  . Vascular insufficiency of extremity    L leg    Patient Active Problem List   Diagnosis Date Noted  . Sprain of right knee 01/19/2020    History reviewed. No pertinent surgical history.     No family history on file.  Social History   Tobacco Use  . Smoking status: Current Every Day Smoker    Packs/day: 1.00    Types: Cigarettes  . Smokeless tobacco: Never Used  Substance Use Topics  . Alcohol use: Not Currently  . Drug use: Not Currently    Home Medications Prior to Admission medications   Medication Sig Start Date End Date Taking? Authorizing Provider  methocarbamol (ROBAXIN) 500 MG tablet Take 1 tablet (500 mg total) by mouth every 8 (eight) hours as needed for muscle spasms. 01/25/20   Milagros Loll, MD  naproxen (NAPROSYN) 500 MG tablet Take 1 tablet (500 mg total) by mouth 2 (two) times daily. 01/25/20   Milagros Loll, MD    Allergies    Patient has no known allergies.  Review of Systems   Review of Systems  Constitutional: Negative for chills and fever.  HENT:  Negative for ear pain and sore throat.   Eyes: Negative for pain and visual disturbance.  Respiratory: Negative for cough and shortness of breath.   Cardiovascular: Negative for chest pain and palpitations.  Gastrointestinal: Negative for abdominal pain and vomiting.  Genitourinary: Negative for dysuria and hematuria.  Musculoskeletal: Positive for back pain. Negative for arthralgias.  Skin: Negative for color change and rash.  Neurological: Negative for seizures and syncope.  All other systems reviewed and are negative.   Physical Exam Updated Vital Signs BP 126/84 (BP Location: Left Arm)   Pulse 84   Temp 98.8 F (37.1 C) (Oral)   Resp 18   Ht 6' (1.829 m)   Wt 117.9 kg   SpO2 100%   BMI 35.26 kg/m   Physical Exam Constitutional:      Appearance: Normal appearance.  HENT:     Head: Normocephalic and atraumatic.     Nose: Nose normal.     Mouth/Throat:     Mouth: Mucous membranes are moist.  Cardiovascular:     Rate and Rhythm: Normal rate.     Pulses: Normal pulses.  Pulmonary:     Effort: Pulmonary effort is normal. No respiratory distress.  Abdominal:     General: Abdomen is flat.     Palpations: Abdomen is soft.  Musculoskeletal:     Cervical back: Neck supple. No rigidity.     Comments: TTP across lower lumbar region, no stepoff or deformity over midline spine  Skin:    Capillary Refill: Capillary refill takes less than 2 seconds.  Neurological:     General: No focal deficit present.     Mental Status: He is alert and oriented to person, place, and time.     Comments: 5/5 strength in b/l LE, sensation intact in b/l LE  Psychiatric:        Mood and Affect: Mood normal.        Behavior: Behavior normal.     ED Results / Procedures / Treatments   Labs (all labs ordered are listed, but only abnormal results are displayed) Labs Reviewed - No data to display  EKG None  Radiology DG Lumbar Spine Complete  Result Date: 01/25/2020 CLINICAL DATA:  Low  back pain. EXAM: LUMBAR SPINE - COMPLETE 4+ VIEW COMPARISON:  None. FINDINGS: There is no acute displaced fracture or dislocation. There are degenerative changes throughout the lumbar spine, greatest at the L5-S1 and T11-T12 levels. There is some mild anterior wedging of the T12 vertebral body which is felt to be chronic. There is some facet arthrosis in the lower lumbar segments. Moderate stool burden. IMPRESSION: No acute abnormality.  Degenerative changes as detailed above. Electronically Signed   By: Constance Holster M.D.   On: 01/25/2020 21:38    Procedures Procedures (including critical care time)  Medications Ordered in ED Medications  ketorolac (TORADOL) injection 30 mg (30 mg Intramuscular Given 01/25/20 2054)  methocarbamol (ROBAXIN) tablet 500 mg (500 mg Oral Given 01/25/20 2055)    ED Course  I have reviewed the triage vital signs and the nursing notes.  Pertinent labs & imaging results that were available during my care of the patient were reviewed by me and considered in my medical decision making (see chart for details).    MDM Rules/Calculators/A&P                      39 year old male presenting to ER with complaint of low back pain after straining at work.  Here notably well-appearing, normal neurologic exam, ambulatory without difficulty.  No red flag symptoms.  X-ray without acute abnormality.  Suspect MSK strain.  Provided Rx for NSAIDs, muscle relaxer.  Reviewed return precautions, discharged home.    After the discussed management above, the patient was determined to be safe for discharge.  The patient was in agreement with this plan and all questions regarding their care were answered.  ED return precautions were discussed and the patient will return to the ED with any significant worsening of condition.   Final Clinical Impression(s) / ED Diagnoses Final diagnoses:  Strain of lumbar region, initial encounter    Rx / DC Orders ED Discharge Orders          Ordered    naproxen (NAPROSYN) 500 MG tablet  2 times daily     01/25/20 2201    methocarbamol (ROBAXIN) 500 MG tablet  Every 8 hours PRN     01/25/20 2201           Lucrezia Starch, MD 01/26/20 2227

## 2020-02-16 ENCOUNTER — Ambulatory Visit: Payer: 59 | Admitting: Family Medicine

## 2020-02-16 NOTE — Progress Notes (Deleted)
  Jeremiah Mathis - 39 y.o. male MRN [id number]  Date of birth: [birth year]  SUBJECTIVE:  Including CC & ROS.  No chief complaint on file.   Jeremiah Mathis is a 39 y.o. male that is  ***.  ***   Review of Systems See HPI   HISTORY: Past Medical, Surgical, Social, and Family History Reviewed & Updated per EMR.   Pertinent Historical Findings include:  Past Medical History:  Diagnosis Date  . Vascular insufficiency of extremity    L leg    No past surgical history on file.  No family history on file.  Social History   Socioeconomic History  . Marital status: Single    Spouse name: Not on file  . Number of children: Not on file  . Years of education: Not on file  . Highest education level: Not on file  Occupational History  . Not on file  Tobacco Use  . Smoking status: Current Every Day Smoker    Packs/day: 1.00    Types: Cigarettes  . Smokeless tobacco: Never Used  Substance and Sexual Activity  . Alcohol use: Not Currently  . Drug use: Not Currently  . Sexual activity: Not Currently  Other Topics Concern  . Not on file  Social History Narrative  . Not on file   Social Determinants of Health   Financial Resource Strain:   . Difficulty of Paying Living Expenses:   Food Insecurity:   . Worried About Programme researcher, broadcasting/film/video in the Last Year:   . Barista in the Last Year:   Transportation Needs:   . Freight forwarder (Medical):   Marland Kitchen Lack of Transportation (Non-Medical):   Physical Activity:   . Days of Exercise per Week:   . Minutes of Exercise per Session:   Stress:   . Feeling of Stress :   Social Connections:   . Frequency of Communication with Friends and Family:   . Frequency of Social Gatherings with Friends and Family:   . Attends Religious Services:   . Active Member of Clubs or Organizations:   . Attends Banker Meetings:   Marland Kitchen Marital Status:   Intimate Partner Violence:   . Fear of Current or Ex-Partner:   . Emotionally  Abused:   Marland Kitchen Physically Abused:   . Sexually Abused:      PHYSICAL EXAM:  VS: There were no vitals taken for this visit. Physical Exam Gen: NAD, alert, cooperative with exam, well-appearing MSK:  ***      ASSESSMENT & PLAN:   No problem-specific Assessment & Plan notes found for this encounter.

## 2020-07-29 DIAGNOSIS — F39 Unspecified mood [affective] disorder: Secondary | ICD-10-CM | POA: Insufficient documentation

## 2020-07-29 DIAGNOSIS — R443 Hallucinations, unspecified: Secondary | ICD-10-CM | POA: Insufficient documentation

## 2020-07-29 DIAGNOSIS — Z7289 Other problems related to lifestyle: Secondary | ICD-10-CM | POA: Insufficient documentation

## 2021-02-27 DIAGNOSIS — G4733 Obstructive sleep apnea (adult) (pediatric): Secondary | ICD-10-CM | POA: Insufficient documentation

## 2022-01-10 IMAGING — DX DG LUMBAR SPINE COMPLETE 4+V
5 series · 5 of 5 positions shown · non-contrast
Comparison: None.

CLINICAL DATA: Low back pain.

EXAM:
LUMBAR SPINE - COMPLETE 4+ VIEW

[l-spine obl (1 of 2)]
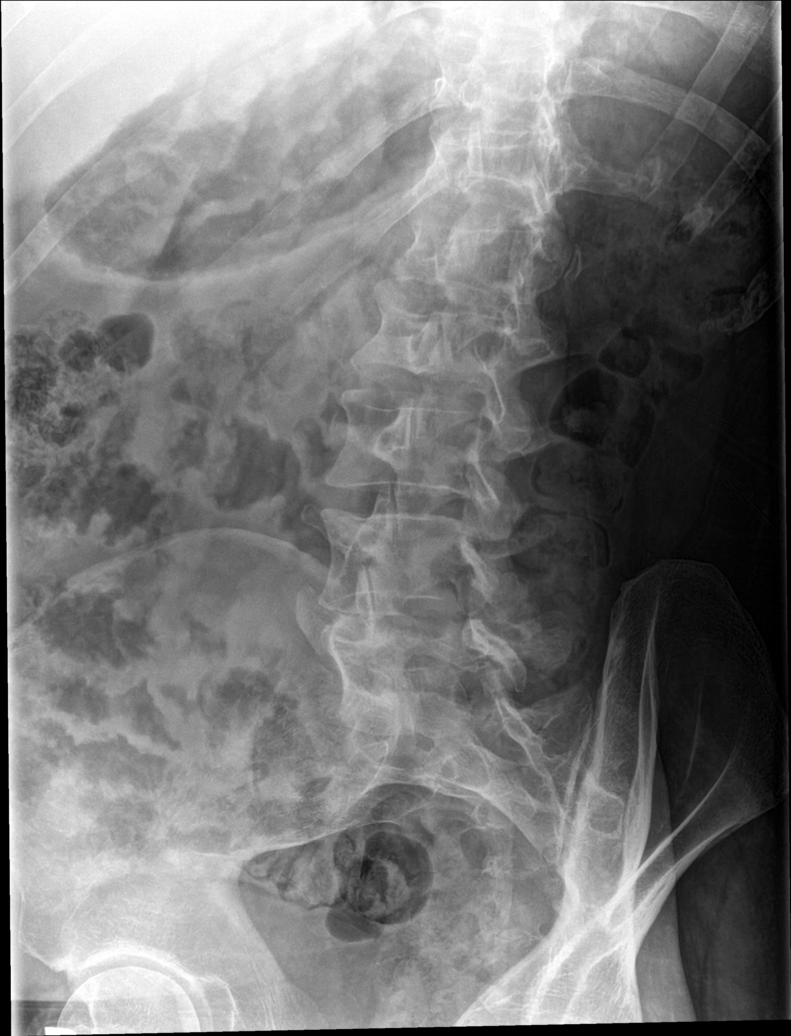

[l-spine ap]
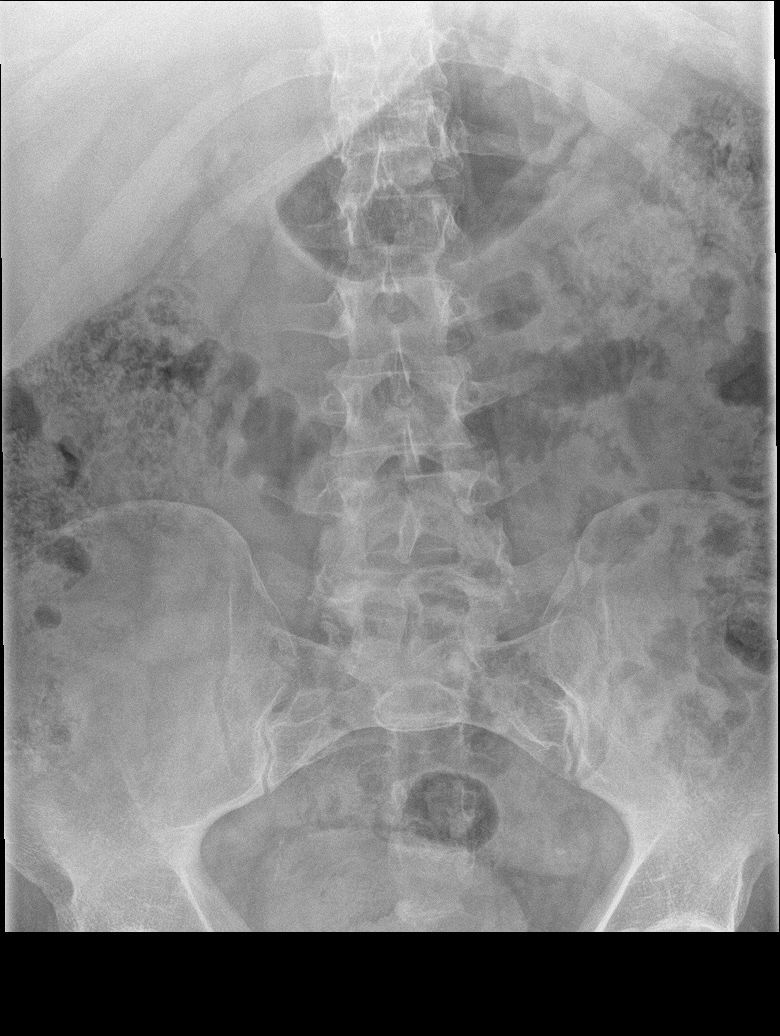

[l-spine obl (2 of 2)]
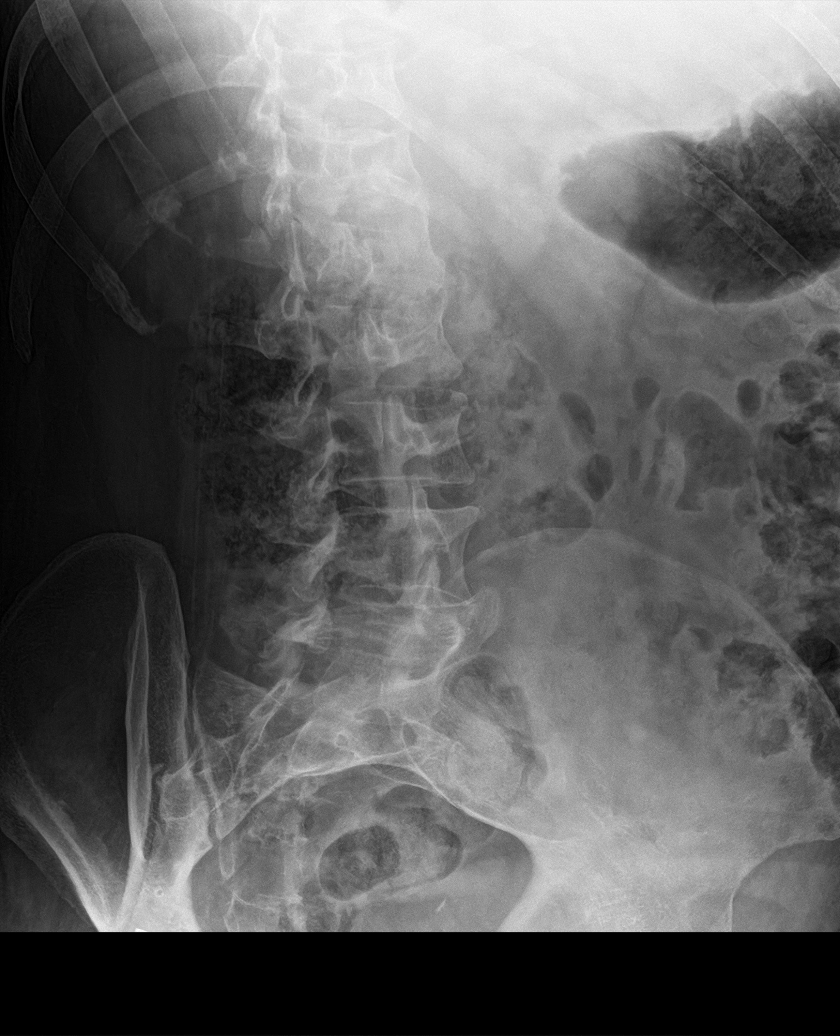

[l-spine lat]
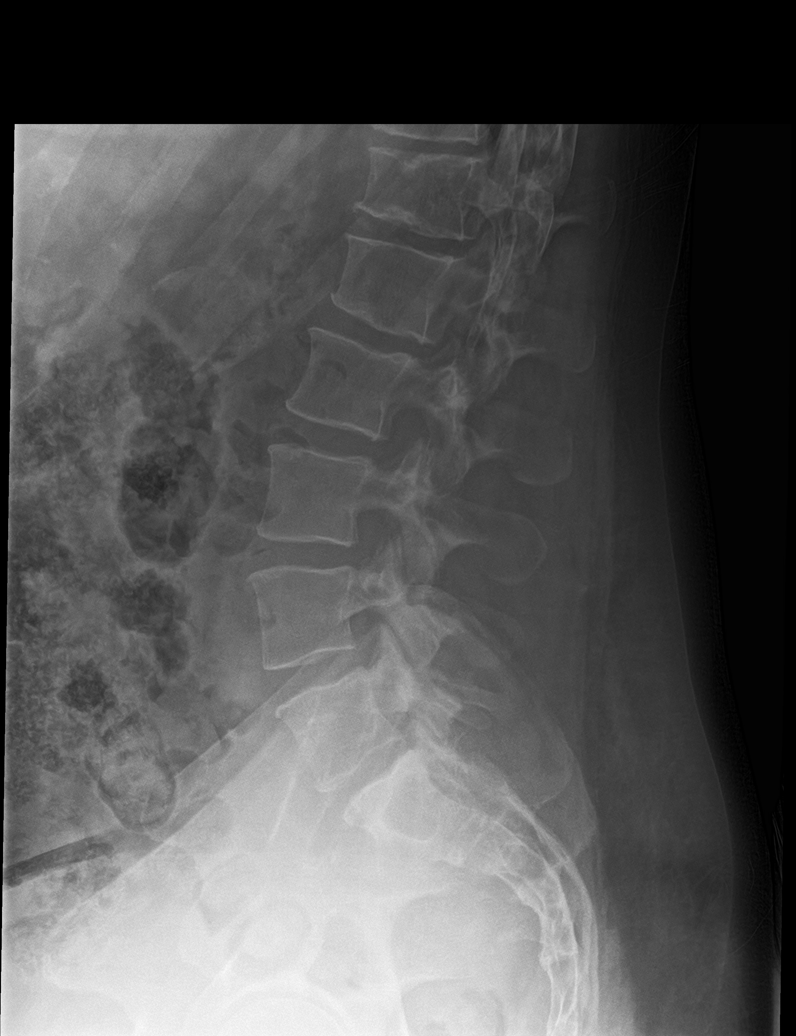

[l-spine spot]
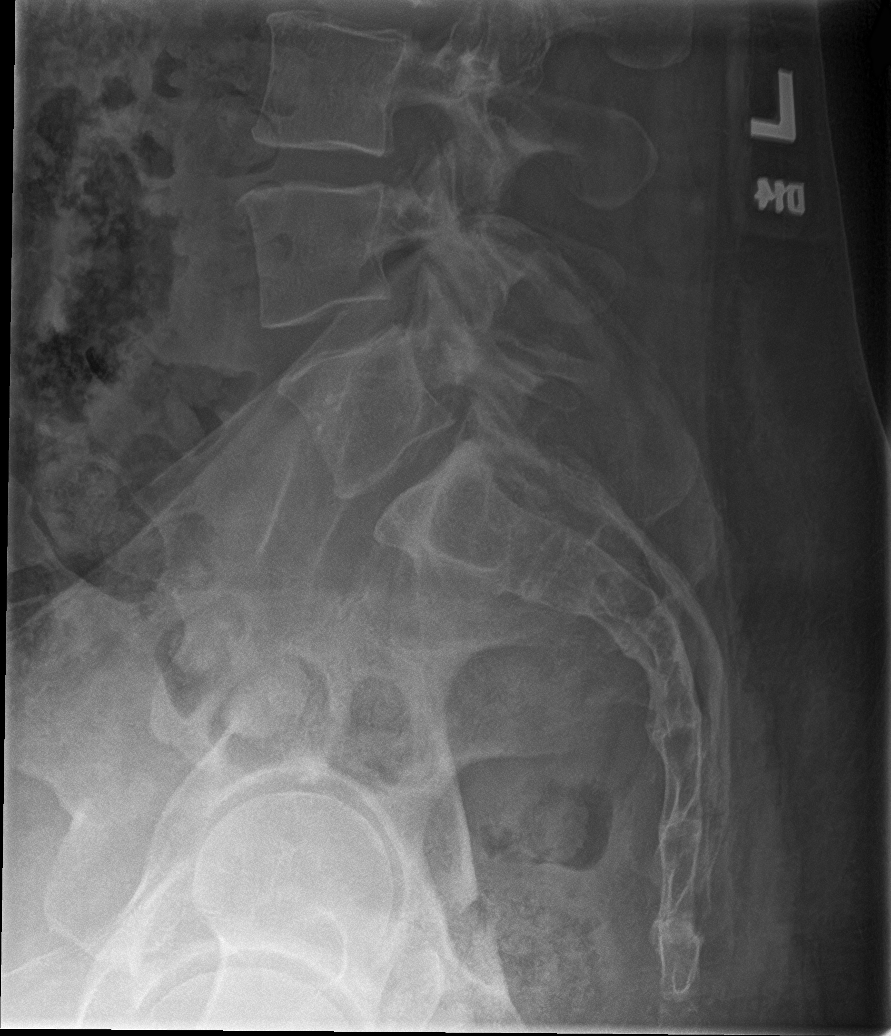

[5 of 5 positions shown; findings below may reference images not displayed]

FINDINGS: There is no acute displaced fracture or dislocation. There are
degenerative changes throughout the lumbar spine, greatest at the
L5-S1 and T11-T12 levels. There is some mild anterior wedging of the
T12 vertebral body which is felt to be chronic. There is some facet
arthrosis in the lower lumbar segments. Moderate stool burden.
IMPRESSION: No acute abnormality.  Degenerative changes as detailed above.

## 2022-12-21 ENCOUNTER — Encounter: Payer: Self-pay | Admitting: Neurology

## 2022-12-21 ENCOUNTER — Ambulatory Visit: Payer: 59 | Admitting: Neurology

## 2023-02-08 ENCOUNTER — Encounter: Payer: Self-pay | Admitting: *Deleted
